# Patient Record
Sex: Male | Born: 1995 | Race: Black or African American | Hispanic: No | Marital: Single | Smoking: Never smoker
Health system: Southern US, Community
[De-identification: ages and names within clinical notes are randomized; demographics above are authoritative.]

## PROBLEM LIST (undated history)

## (undated) ENCOUNTER — Emergency Department (HOSPITAL_COMMUNITY): Admission: EM | Payer: Self-pay | Source: Home / Self Care

## (undated) DIAGNOSIS — J45909 Unspecified asthma, uncomplicated: Secondary | ICD-10-CM

---
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
## 2015-06-08 ENCOUNTER — Emergency Department (HOSPITAL_COMMUNITY): Payer: Medicaid Other

## 2015-06-08 ENCOUNTER — Encounter (HOSPITAL_COMMUNITY): Payer: Self-pay | Admitting: Emergency Medicine

## 2015-06-08 ENCOUNTER — Emergency Department (HOSPITAL_COMMUNITY)
Admission: EM | Admit: 2015-06-08 | Discharge: 2015-06-08 | Disposition: A | Discharge status: Home / Self Care | Payer: Medicaid Other | Attending: Emergency Medicine | Admitting: Emergency Medicine

## 2015-06-08 DIAGNOSIS — Y9389 Activity, other specified: Secondary | ICD-10-CM | POA: Insufficient documentation

## 2015-06-08 DIAGNOSIS — T148XXA Other injury of unspecified body region, initial encounter: Secondary | ICD-10-CM

## 2015-06-08 DIAGNOSIS — T148 Other injury of unspecified body region: Secondary | ICD-10-CM | POA: Diagnosis not present

## 2015-06-08 DIAGNOSIS — H539 Unspecified visual disturbance: Secondary | ICD-10-CM | POA: Diagnosis not present

## 2015-06-08 DIAGNOSIS — S0990XA Unspecified injury of head, initial encounter: Secondary | ICD-10-CM | POA: Diagnosis not present

## 2015-06-08 DIAGNOSIS — Y92410 Unspecified street and highway as the place of occurrence of the external cause: Secondary | ICD-10-CM | POA: Insufficient documentation

## 2015-06-08 DIAGNOSIS — R42 Dizziness and giddiness: Secondary | ICD-10-CM | POA: Diagnosis not present

## 2015-06-08 DIAGNOSIS — S4991XA Unspecified injury of right shoulder and upper arm, initial encounter: Secondary | ICD-10-CM | POA: Insufficient documentation

## 2015-06-08 DIAGNOSIS — Y998 Other external cause status: Secondary | ICD-10-CM | POA: Diagnosis not present

## 2015-06-08 MED ORDER — ACETAMINOPHEN 325 MG PO TABS
650.0000 mg | ORAL_TABLET | Freq: Once | ORAL | Status: AC
  Administered 2015-06-08: 650 mg via ORAL
  Filled 2015-06-08: qty 2

## 2015-06-08 MED ORDER — METAXALONE 800 MG PO TABS: 800.0000 mg | ORAL_TABLET | Freq: Three times a day (TID) | ORAL | Status: AC

## 2015-06-08 MED ORDER — IBUPROFEN 800 MG PO TABS: 800.0000 mg | ORAL_TABLET | Freq: Three times a day (TID) | ORAL | Status: AC
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
## 2015-06-08 NOTE — Discharge Instructions (Signed)
Head Injury °You have received a head injury. It does not appear serious at this time. Headaches and vomiting are common following head injury. It should be easy to awaken from sleeping. Sometimes it is necessary for you to stay in the emergency department for a while for observation. Sometimes admission to the hospital may be needed. After injuries such as yours, most problems occur within the first 24 hours, but side effects may occur up to 7-10 days after the injury. It is important for you to carefully monitor your condition and contact your health care provider or seek immediate medical care if there is a change in your condition. °WHAT ARE THE TYPES OF HEAD INJURIES? °Head injuries can be as minor as a bump. Some head injuries can be more severe. More severe head injuries include: °· A jarring injury to the brain (concussion). °· A bruise of the brain (contusion). This mean there is bleeding in the brain that can cause swelling. °· A cracked skull (skull fracture). °· Bleeding in the brain that collects, clots, and forms a bump (hematoma). °WHAT CAUSES A HEAD INJURY? °A serious head injury is most likely to happen to someone who is in a car wreck and is not wearing a seat belt. Other causes of major head injuries include bicycle or motorcycle accidents, sports injuries, and falls. °HOW ARE HEAD INJURIES DIAGNOSED? °A complete history of the event leading to the injury and your current symptoms will be helpful in diagnosing head injuries. Many times, pictures of the brain, such as CT or MRI are needed to see the extent of the injury. Often, an overnight hospital stay is necessary for observation.  °WHEN SHOULD I SEEK IMMEDIATE MEDICAL CARE?  °You should get help right away if: °· You have confusion or drowsiness. °· You feel sick to your stomach (nauseous) or have continued, forceful vomiting. °· You have dizziness or unsteadiness that is getting worse. °· You have severe, continued headaches not relieved by  medicine. Only take over-the-counter or prescription medicines for pain, fever, or discomfort as directed by your health care provider. °· You do not have normal function of the arms or legs or are unable to walk. °· You notice changes in the black spots in the center of the colored part of your eye (pupil). °· You have a clear or bloody fluid coming from your nose or ears. °· You have a loss of vision. °During the next 24 hours after the injury, you must stay with someone who can watch you for the warning signs. This person should contact local emergency services (911 in the [place] if you have seizures, you become unconscious, or you are unable to wake up. °HOW CAN I PREVENT A HEAD INJURY IN THE FUTURE? °The most important factor for preventing major head injuries is avoiding motor vehicle accidents.  To minimize the potential for damage to your head, it is crucial to wear seat belts while riding in motor vehicles. Wearing helmets while bike riding and playing collision sports (like football) is also helpful. Also, avoiding dangerous activities around the house will further help reduce your risk of head injury.  °WHEN CAN I RETURN TO NORMAL ACTIVITIES AND ATHLETICS? °You should be reevaluated by your health care provider before returning to these activities. If you have any of the following symptoms, you should not return to activities or contact sports until 1 week after the symptoms have stopped: °· Persistent headache. °· Dizziness or vertigo. °· Poor attention and concentration. °· Confusion. °·   Memory problems.  Nausea or vomiting.  Fatigue or tire easily.  Irritability.  Intolerant of bright lights or loud noises.  Anxiety or depression.  Disturbed sleep. MAKE SURE YOU:   Understand these instructions.  Will watch your condition.  Will get help right away if you are not doing well or get worse. Document Released: 12/08/2005 Document Revised: 12/13/2013 Document Reviewed:  08/15/2013 [facility] Patient Information 2015 [place], [place]. This information is not intended to replace advice given to you by your health care provider. Make sure you discuss any questions you have with your health care provider.  Motor Vehicle Collision After a car crash (motor vehicle collision), it is normal to have bruises and sore muscles. The first 24 hours usually feel the worst. After that, you will likely start to feel better each day. HOME CARE  Put ice on the injured area.  Put ice in a plastic bag.  Place a towel between your skin and the bag.  Leave the ice on for 15-20 minutes, 03-04 times a day.  Drink enough fluids to keep your pee (urine) clear or pale yellow.  Do not drink alcohol.  Take a warm shower or bath 1 or 2 times a day. This helps your sore muscles.  Return to activities as told by your doctor. Be careful when lifting. Lifting can make neck or back pain worse.  Only take medicine as told by your doctor. Do not use aspirin. GET HELP RIGHT AWAY IF:   Your arms or legs tingle, feel weak, or lose feeling (numbness).  You have headaches that do not get better with medicine.  You have neck pain, especially in the middle of the back of your neck.  You cannot control when you pee (urinate) or poop (bowel movement).  Pain is getting worse in any part of your body.  You are short of breath, dizzy, or pass out (faint).  You have chest pain.  You feel sick to your stomach (nauseous), throw up (vomit), or sweat.  You have belly (abdominal) pain that gets worse.  There is blood in your pee, poop, or throw up.  You have pain in your shoulder (shoulder strap areas).  Your problems are getting worse. MAKE SURE YOU:   Understand these instructions.  Will watch your condition.  Will get help right away if you are not doing well or get worse. Document Released: 05/26/2008 Document Revised: 03/01/2012 Document Reviewed: 05/07/2011 [facility] Patient  Information 2015 [place], [place]. This information is not intended to replace advice given to you by your health care provider. Make sure you discuss any questions you have with your health care provider.

## 2015-11-29 ENCOUNTER — Emergency Department (HOSPITAL_COMMUNITY)
Admission: EM | Admit: 2015-11-29 | Discharge: 2015-11-29 | Disposition: A | Discharge status: Home / Self Care | Payer: Medicaid Other | Attending: Emergency Medicine | Admitting: Emergency Medicine

## 2015-11-29 ENCOUNTER — Emergency Department (HOSPITAL_COMMUNITY): Payer: Medicaid Other

## 2015-11-29 ENCOUNTER — Encounter (HOSPITAL_COMMUNITY): Payer: Self-pay | Admitting: *Deleted

## 2015-11-29 DIAGNOSIS — R197 Diarrhea, unspecified: Secondary | ICD-10-CM | POA: Insufficient documentation

## 2015-11-29 DIAGNOSIS — R0989 Other specified symptoms and signs involving the circulatory and respiratory systems: Secondary | ICD-10-CM | POA: Insufficient documentation

## 2015-11-29 DIAGNOSIS — R69 Illness, unspecified: Secondary | ICD-10-CM

## 2015-11-29 DIAGNOSIS — J111 Influenza due to unidentified influenza virus with other respiratory manifestations: Secondary | ICD-10-CM | POA: Insufficient documentation

## 2015-11-29 NOTE — ED Notes (Addendum)
Pt states productive cough began Tuesday, dark yellow in color. States he vomited x 2 yesterday and one episode of loose stool this morning. States fever at times. Also states headache.

## 2015-11-29 NOTE — Discharge Instructions (Signed)

## 2015-11-29 NOTE — ED Provider Notes (Signed)
CSN: [id number]     Arrival date & time 11/29/15  1021 History  By signing my name below, I, Lyndel SafeKaitlyn Shelton, attest that this documentation has been prepared under the direction and in the presence of Glynn OctaveStephen Bernardine Langworthy, MD. Electronically Signed: Lyndel SafeKaitlyn Shelton, ED Scribe. 11/29/2015. 11:13 AM.  Chief Complaint  Patient presents with  . Cough  . Emesis   The history is provided by the patient. No language interpreter was used.   HPI Comments: Nathaniel Sanders is a 19 y.o. male, who is a current, daily smoker, presents to the Emergency Department complaining of a constant, moderate cough onset 2 days ago that is productive with dark yellow sputum and induces chest pain that is present only while coughing. He notes an associated headache, chills, rhinorrhea, and 2 episodes of non-post tussive emesis yesterday and 1 episode of diarrhea this morning. Pt has not taken his temperature at home. He has not taken any alleviating medication nor has he tried any alleviating treatments for his symptoms. He did not receive a flu vaccination this year. He reports smoking 2-3 single cigarettes per day. No cardiac or respiratory history. Denies any rashes, generalized myalgias, sick contacts, or recent abroad travel.   History reviewed. No pertinent past medical history. History reviewed. No pertinent past surgical history. No family history on file. Social History  Substance Use Topics  . Smoking status: Never Smoker   . Smokeless tobacco: Never Used  . Alcohol Use: No    Review of Systems  Constitutional: Positive for chills.  HENT: Positive for rhinorrhea.   Respiratory: Positive for cough. Negative for shortness of breath.   Gastrointestinal: Positive for vomiting and diarrhea. Negative for abdominal pain.  Musculoskeletal: Negative for myalgias and arthralgias.  Skin: Negative for rash.  Neurological: Positive for headaches.  A complete 10 system review of systems was obtained and is otherwise  negative except at noted in the HPI and PMH.  Allergies  Sulfa antibiotics  Home Medications   Prior to Admission medications   Medication Sig Start Date End Date Taking? Authorizing Provider  ibuprofen (ADVIL,MOTRIN) 800 MG tablet Take 1 tablet (800 mg total) by mouth 3 (three) times daily. Patient not taking: Reported on 11/29/2015 06/08/15   Burgess AmorJulie Idol, PA-C  metaxalone (SKELAXIN) 800 MG tablet Take 1 tablet (800 mg total) by mouth 3 (three) times daily. Patient not taking: Reported on 11/29/2015 06/08/15   Burgess AmorJulie Idol, PA-C   BP 107/74 mmHg  Pulse 68  Temp(Src) 98.3 F (36.8 C) (Oral)  Resp 18  Ht 5\' 11"  (1.803 m)  Wt 160 lb (72.576 kg)  BMI 22.33 kg/m2  SpO2 99% Physical Exam  Constitutional: He is oriented to person, place, and time. He appears well-developed and well-nourished. No distress.  HENT:  Head: Normocephalic and atraumatic.  Mouth/Throat: Posterior oropharyngeal erythema present. No oropharyngeal exudate.  Mild oropharynx erythema.   Eyes: Conjunctivae and EOM are normal. Pupils are equal, round, and reactive to light.  Neck: Normal range of motion. Neck supple.  No meningismus.  Cardiovascular: Normal rate, regular rhythm, normal heart sounds and intact distal pulses.   No murmur heard. Pulmonary/Chest: Effort normal. No respiratory distress. He has rhonchi. He exhibits no tenderness.  Diffuse, scattered rhonchi.   Abdominal: Soft. There is no tenderness. There is no rebound and no guarding.  Musculoskeletal: Normal range of motion. He exhibits no edema or tenderness.  Neurological: He is alert and oriented to person, place, and time. No cranial nerve deficit. He exhibits normal muscle  tone. Coordination normal.  MAE X4.  Skin: Skin is warm.  Psychiatric: He has a normal mood and affect. His behavior is normal.  Nursing note and vitals reviewed.   ED Course  Procedures  DIAGNOSTIC STUDIES: Oxygen Saturation is 99% on RA, normal by my interpretation.     COORDINATION OF CARE: 11:10 AM Discussed treatment plan which includes to order a chest Xray with pt. Pt acknowledges and agrees to plan.   Imaging Review Dg Chest 2 View  11/29/2015  CLINICAL DATA:  Cough and fever EXAM: CHEST  2 VIEW COMPARISON:  None. FINDINGS: Pulmonary hyperinflation.  Question asthma. Negative for pneumonia. Lungs are clear of infiltrate or effusion. Heart and mediastinum are normal. IMPRESSION: Pulmonary hyperinflation.  Negative for pneumonia Electronically Signed   By: Marlan Palau M.D.   On: 11/29/2015 11:58   I have personally reviewed and evaluated these images as part of my medical decision-making.   MDM   Final diagnoses:  Influenza-like illness   2 days of cough, body aches, chills, nausea, vomiting and diarrhea. No documented fever. No chest pain or shortness of breath.  Nontoxic appearing. No meningismus. Lungs clear. Chest x-ray is negative.  Supportive care for viral syndrome. Possibly influenza. Discussed antipyretics, by mouth hydration, follow up PCP. Return always precautions discussed.  I personally performed the services described in this documentation, which was scribed in my presence. The recorded information has been reviewed and is accurate.   Glynn Octave, MD 11/29/15 [phone]

## 2018-12-27 ENCOUNTER — Emergency Department (HOSPITAL_COMMUNITY)
Admission: EM | Admit: 2018-12-27 | Discharge: 2018-12-27 | Disposition: A | Discharge status: Home / Self Care | Payer: Self-pay | Attending: Emergency Medicine | Admitting: Emergency Medicine

## 2018-12-27 ENCOUNTER — Encounter (HOSPITAL_COMMUNITY): Payer: Self-pay | Admitting: *Deleted

## 2018-12-27 DIAGNOSIS — Z79899 Other long term (current) drug therapy: Secondary | ICD-10-CM | POA: Insufficient documentation

## 2018-12-27 DIAGNOSIS — R197 Diarrhea, unspecified: Secondary | ICD-10-CM | POA: Insufficient documentation

## 2018-12-27 MED ORDER — LOPERAMIDE HCL 2 MG PO CAPS: 4.0000 mg | ORAL_CAPSULE | Freq: Four times a day (QID) | ORAL | 0 refills | Status: AC | PRN

## 2018-12-27 NOTE — ED Provider Notes (Signed)
[facility] EMERGENCY DEPARTMENT Provider Note   CSN: [id number] Arrival date & time: 12/27/18  1413     History   Chief Complaint No chief complaint on file.   HPI Nathaniel Sanders is a 23 y.o. male.  HPI Patient presents to the emergency department with diarrhea that started today.  The patient states he had 7 episodes yesterday and 2 today.  Patient states that nothing seems to make the condition better or worse.  Patient did not take any medications prior to arrival for symptoms.  The patient denies chest pain, shortness of breath, headache,blurred vision, neck pain, fever, cough, weakness, numbness, dizziness, anorexia, edema, abdominal pain, nausea, vomiting, rash, back pain, dysuria, hematemesis, bloody stool, near syncope, or syncope. History reviewed. No pertinent past medical history.  There are no active problems to display for this patient.   History reviewed. No pertinent surgical history.      Home Medications    Prior to Admission medications   Medication Sig Start Date End Date Taking? Authorizing Provider  albuterol (PROVENTIL HFA;VENTOLIN HFA) 108 (90 Base) MCG/ACT inhaler Inhale 2 puffs into the lungs every 6 (six) hours as needed for wheezing or shortness of breath.   Yes [provider]  ibuprofen (ADVIL,MOTRIN) 800 MG tablet Take 1 tablet (800 mg total) by mouth 3 (three) times daily. Patient not taking: Reported on 11/29/2015 06/08/15   Burgess Amor, PA-C  metaxalone (SKELAXIN) 800 MG tablet Take 1 tablet (800 mg total) by mouth 3 (three) times daily. Patient not taking: Reported on 11/29/2015 06/08/15   Burgess Amor, PA-C    Family History History reviewed. No pertinent family history.  Social History Social History   Tobacco Use  . Smoking status: Never Smoker  . Smokeless tobacco: Never Used  Substance Use Topics  . Alcohol use: No  . Drug use: No     Allergies   Sulfa antibiotics   Review of Systems Review of  Systems  All other systems negative except as documented in the HPI. All pertinent positives and negatives as reviewed in the HPI. Physical Exam Updated Vital Signs BP 126/74 (BP Location: Left Arm)   Pulse (!) 107   Temp 98.9 F (37.2 C) (Oral)   Resp 18   SpO2 99%   Physical Exam Vitals signs and nursing note reviewed.  Constitutional:      General: He is not in acute distress.    Appearance: He is well-developed.  HENT:     Head: Normocephalic and atraumatic.  Eyes:     Pupils: Pupils are equal, round, and reactive to light.  Neck:     Musculoskeletal: Normal range of motion and neck supple.  Cardiovascular:     Rate and Rhythm: Normal rate and regular rhythm.     Heart sounds: Normal heart sounds. No murmur. No friction rub. No gallop.   Pulmonary:     Effort: Pulmonary effort is normal. No respiratory distress.     Breath sounds: Normal breath sounds. No wheezing.  Abdominal:     General: Bowel sounds are normal. There is no distension.     Palpations: Abdomen is soft.     Tenderness: There is no abdominal tenderness.  Skin:    General: Skin is warm and dry.     Capillary Refill: Capillary refill takes less than 2 seconds.     Findings: No erythema or rash.  Neurological:     Mental Status: He is alert and oriented to person, place, and time.  Motor: No abnormal muscle tone.     Coordination: Coordination normal.  Psychiatric:        Behavior: Behavior normal.      ED Treatments / Results  Labs (all labs ordered are listed, but only abnormal results are displayed) Labs Reviewed - No data to display  EKG None  Radiology No results found.  Procedures Procedures (including critical care time)  Medications Ordered in ED Medications - No data to display   Initial Impression / Assessment and Plan / ED Course  I have reviewed the triage vital signs and the nursing notes.  Pertinent labs & imaging results that were available during my care of the  patient were reviewed by me and considered in my medical decision making (see chart for details).     Treated for the diarrhea which seems to be improving since he will had 2 episodes today.  Patient is advised to return here as needed.  Told to increase his fluid intake.  Final Clinical Impressions(s) / ED Diagnoses   Final diagnoses:  None    ED Discharge Orders    None       Charlestine Night, PA-C 12/27/18 1756    Sabas Sous, MD 12/27/18 [phone]

## 2018-12-27 NOTE — ED Triage Notes (Signed)
Pt in stating he thinks he has a stomach bug, c/o diarrhea since yesterday, denies n/v, no distress noted

## 2018-12-27 NOTE — ED Notes (Signed)
Patient verbalizes understanding of discharge instructions. Opportunity for questioning and answers were provided. Armband removed by staff, pt discharged from ED.  

## 2018-12-27 NOTE — Discharge Instructions (Addendum)
Return here as needed.  Increase your fluid intake. 

## 2019-04-07 ENCOUNTER — Other Ambulatory Visit: Payer: Self-pay

## 2019-04-09 ENCOUNTER — Encounter (HOSPITAL_COMMUNITY): Payer: Self-pay | Admitting: *Deleted

## 2019-04-09 ENCOUNTER — Other Ambulatory Visit: Payer: Self-pay

## 2019-04-09 ENCOUNTER — Emergency Department (HOSPITAL_COMMUNITY)
Admission: EM | Admit: 2019-04-09 | Discharge: 2019-04-09 | Disposition: A | Discharge status: Home / Self Care | Payer: Self-pay | Attending: Emergency Medicine | Admitting: Emergency Medicine

## 2019-04-09 ENCOUNTER — Emergency Department (HOSPITAL_COMMUNITY): Payer: Self-pay

## 2019-04-09 DIAGNOSIS — Z79899 Other long term (current) drug therapy: Secondary | ICD-10-CM | POA: Insufficient documentation

## 2019-04-09 DIAGNOSIS — R0789 Other chest pain: Secondary | ICD-10-CM | POA: Insufficient documentation

## 2019-04-09 DIAGNOSIS — J45909 Unspecified asthma, uncomplicated: Secondary | ICD-10-CM | POA: Insufficient documentation

## 2019-04-09 DIAGNOSIS — R110 Nausea: Secondary | ICD-10-CM | POA: Insufficient documentation

## 2019-04-09 DIAGNOSIS — R0602 Shortness of breath: Secondary | ICD-10-CM | POA: Insufficient documentation

## 2019-04-09 DIAGNOSIS — F1290 Cannabis use, unspecified, uncomplicated: Secondary | ICD-10-CM | POA: Insufficient documentation

## 2019-04-09 HISTORY — DX: Unspecified asthma, uncomplicated: J45.909

## 2019-04-09 LAB — CBC WITH DIFFERENTIAL/PLATELET
Abs Immature Granulocytes: 0.01 10*3/uL (ref 0.00–0.07)
Basophils Absolute: 0.1 10*3/uL (ref 0.0–0.1)
Basophils Relative: 1 %
Eosinophils Absolute: 0 10*3/uL (ref 0.0–0.5)
Eosinophils Relative: 0 %
HCT: 45.9 % (ref 39.0–52.0)
Hemoglobin: 14.9 g/dL (ref 13.0–17.0)
Immature Granulocytes: 0 %
Lymphocytes Relative: 26 %
Lymphs Abs: 1.7 10*3/uL (ref 0.7–4.0)
MCH: 30.3 pg (ref 26.0–34.0)
MCHC: 32.5 g/dL (ref 30.0–36.0)
MCV: 93.5 fL (ref 80.0–100.0)
Monocytes Absolute: 0.8 10*3/uL (ref 0.1–1.0)
Monocytes Relative: 13 %
Neutro Abs: 3.9 10*3/uL (ref 1.7–7.7)
Neutrophils Relative %: 60 %
Platelets: 305 10*3/uL (ref 150–400)
RBC: 4.91 MIL/uL (ref 4.22–5.81)
RDW: 13.4 % (ref 11.5–15.5)
WBC: 6.5 10*3/uL (ref 4.0–10.5)
nRBC: 0 % (ref 0.0–0.2)

## 2019-04-09 LAB — BASIC METABOLIC PANEL
Anion gap: 10 (ref 5–15)
BUN: 11 mg/dL (ref 6–20)
CO2: 25 mmol/L (ref 22–32)
Calcium: 9.2 mg/dL (ref 8.9–10.3)
Chloride: 104 mmol/L (ref 98–111)
Creatinine, Ser: 1.25 mg/dL — ABNORMAL HIGH (ref 0.61–1.24)
GFR calc Af Amer: >60 mL/min (ref >60)
GFR calc non Af Amer: >60 mL/min (ref >60)
Glucose, Bld: 97 mg/dL (ref 70–99)
Potassium: 3.4 mmol/L — ABNORMAL LOW (ref 3.5–5.1)
Sodium: 139 mmol/L (ref 135–145)

## 2019-04-09 LAB — D-DIMER, QUANTITATIVE: D-Dimer, Quant: <0.27 ug/mL-FEU (ref 0.00–0.50)

## 2019-04-09 LAB — I-STAT TROPONIN, ED: Troponin i, poc: 0.01 ng/mL (ref 0.00–0.08)

## 2019-04-09 LAB — TSH: TSH: 0.576 u[IU]/mL (ref 0.350–4.500)

## 2019-04-09 LAB — TROPONIN I: Troponin I: <0.03 ng/mL (ref <0.03)

## 2019-04-09 MED ORDER — MORPHINE SULFATE (PF) 4 MG/ML IV SOLN
4.0000 mg | Freq: Once | INTRAVENOUS | Status: AC
  Administered 2019-04-09: 17:00:00 4 mg via INTRAVENOUS
  Filled 2019-04-09: qty 1

## 2019-04-09 MED ORDER — ALUM & MAG HYDROXIDE-SIMETH 200-200-20 MG/5ML PO SUSP
30.0000 mL | Freq: Once | ORAL | Status: AC
  Administered 2019-04-09: 17:00:00 30 mL via ORAL
  Filled 2019-04-09: qty 30

## 2019-04-09 MED ORDER — NITROGLYCERIN 0.4 MG SL SUBL: 0.4000 mg | SUBLINGUAL_TABLET | SUBLINGUAL | Status: DC | PRN

## 2019-04-09 MED ORDER — LIDOCAINE VISCOUS HCL 2 % MT SOLN
15.0000 mL | Freq: Once | OROMUCOSAL | Status: AC
  Administered 2019-04-09: 15 mL via ORAL
  Filled 2019-04-09: qty 15

## 2019-04-09 MED ORDER — ASPIRIN 81 MG PO CHEW
324.0000 mg | CHEWABLE_TABLET | Freq: Once | ORAL | Status: AC
  Administered 2019-04-09: 324 mg via ORAL
  Filled 2019-04-09: qty 4

## 2019-04-09 MED ORDER — SODIUM CHLORIDE 0.9 % IV BOLUS
1000.0000 mL | Freq: Once | INTRAVENOUS | Status: AC
  Administered 2019-04-09: 1000 mL via INTRAVENOUS

## 2019-04-09 MED ORDER — ONDANSETRON HCL 4 MG/2ML IJ SOLN
4.0000 mg | Freq: Once | INTRAMUSCULAR | Status: AC
  Administered 2019-04-09: 17:00:00 4 mg via INTRAVENOUS
  Filled 2019-04-09: qty 2

## 2019-04-09 NOTE — ED Notes (Addendum)
Called down to lab to follow up about troponin test.

## 2019-04-09 NOTE — ED Notes (Signed)
Pt reported the center of his chest felt like it was "tingling". RN aware.

## 2019-04-09 NOTE — ED Provider Notes (Signed)
Patient accepted at signout to follow-up on troponin.  Second troponin is normal.  Patient is alert and in no distress.  His vital signs are stable.  We have reviewed return precautions and follow-up plan to get established with a PCP.   Arby Barrette, MD 04/09/19 2140

## 2019-04-09 NOTE — ED Notes (Signed)
Patient returned from xray.

## 2019-04-09 NOTE — ED Provider Notes (Signed)
[facility]-EMERGENCY DEPT Provider Note   CSN: [id number] Arrival date & time: 04/09/19  1532    History   Chief Complaint Chief Complaint  Patient presents with  . Shortness of Breath    HPI    Nathaniel Sanders is a 23 y.o. male with a PMHx of asthma, who presents to the ED with complaints of sudden onset shortness of breath and palpitations that occurred about 1 hour prior to arrival after he smoked marijuana.  Patient states that shortly after smoking marijuana, he suddenly felt short of breath and felt like his heart was racing.  He states that the palpitations felt like a fast heart rate but not irregular.  He states that he checked his heart rate and it was in the 130s.  He reports associated lightheadedness with standing that improves with laying down as well as nausea.  Upon arrival he started experiencing chest pain which he describes as 5/10 constant burning and pressure in the center of his chest that radiates into the left shoulder, with no known aggravating or alleviating factors, no treatments tried prior to arrival.  He also reports having tingling in both hands and a "jittery feeling" throughout his body.  He does not have a PCP at this time.  He denies drinking caffeine or energy drinks.  He is a positive family history of MI in his maternal grandfather as well as his father in his 5530s.  He smokes Black and milds and marijuana but does not smoke cigarettes.  He denies any recent fevers or chills, cough, diaphoresis, leg swelling, recent travel/surgery/immobilization, personal or family history of PE/DVT, abdominal pain, vomiting, diarrhea, constipation, dysuria, hematuria, numbness, focal weakness, claudication, orthopnea, or any other complaints at this time.  The history is provided by the patient and medical records. No language interpreter was used.  Shortness of Breath  Associated symptoms: chest pain   Associated symptoms: no abdominal pain, no cough,  no diaphoresis, no fever and no vomiting     Past Medical History:  Diagnosis Date  . Asthma     There are no active problems to display for this patient.   History reviewed. No pertinent surgical history.      Home Medications    Prior to Admission medications   Medication Sig Start Date End Date Taking? Authorizing Provider  albuterol (PROVENTIL HFA;VENTOLIN HFA) 108 (90 Base) MCG/ACT inhaler Inhale 2 puffs into the lungs every 6 (six) hours as needed for wheezing or shortness of breath.    [provider]  ibuprofen (ADVIL,MOTRIN) 800 MG tablet Take 1 tablet (800 mg total) by mouth 3 (three) times daily. Patient not taking: Reported on 11/29/2015 06/08/15   Burgess AmorIdol, Julie, PA-C  loperamide (IMODIUM) 2 MG capsule Take 2 capsules (4 mg total) by mouth 4 (four) times daily as needed for diarrhea or loose stools. 12/27/18   Lawyer, Cristal Deerhristopher, PA-C  metaxalone (SKELAXIN) 800 MG tablet Take 1 tablet (800 mg total) by mouth 3 (three) times daily. Patient not taking: Reported on 11/29/2015 06/08/15   Burgess AmorIdol, Julie, PA-C    Family History No family history on file.  Social History Social History   Tobacco Use  . Smoking status: Never Smoker  . Smokeless tobacco: Never Used  Substance Use Topics  . Alcohol use: No  . Drug use: Yes    Types: Marijuana     Allergies   Sulfa antibiotics   Review of Systems Review of Systems  Constitutional: Negative for chills, diaphoresis and  fever.  Respiratory: Positive for shortness of breath. Negative for cough.   Cardiovascular: Positive for chest pain and palpitations. Negative for leg swelling.  Gastrointestinal: Positive for nausea. Negative for abdominal pain, constipation, diarrhea and vomiting.  Genitourinary: Negative for dysuria and hematuria.  Musculoskeletal: Negative for arthralgias and myalgias.  Skin: Negative for color change.  Allergic/Immunologic: Negative for immunocompromised state.  Neurological: Positive for  light-headedness. Negative for weakness and numbness.  Psychiatric/Behavioral: Negative for confusion.   All other systems reviewed and are negative for acute change except as noted in the HPI.    Physical Exam Updated Vital Signs BP 138/75 (BP Location: Right Arm)   Pulse 99   Temp 98.7 F (37.1 C) (Oral)   Resp 15   Ht  (1.803 m)   Wt 90.7 kg   SpO2 98%   BMI 27.89 kg/m   Physical Exam Vitals signs and nursing note reviewed.  Constitutional:      General: He is not in acute distress.    Appearance: Normal appearance. He is well-developed. He is not toxic-appearing.     Comments: Afebrile, nontoxic, appears mildly anxious but otherwise in NAD  HENT:     Head: Normocephalic and atraumatic.  Eyes:     General:        Right eye: No discharge.        Left eye: No discharge.     Conjunctiva/sclera: Conjunctivae normal.  Neck:     Musculoskeletal: Normal range of motion and neck supple.  Cardiovascular:     Rate and Rhythm: Regular rhythm. Tachycardia present.     Pulses: Normal pulses.     Heart sounds: Normal heart sounds, S1 normal and S2 normal. No murmur. No friction rub. No gallop.      Comments: Mildly tachycardic in the low 100s during exam, reg rhythm, nl s1/s2, no m/r/g, distal pulses intact, no pedal edema, neg homan's bilaterally Pulmonary:     Effort: Pulmonary effort is normal. No respiratory distress.     Breath sounds: Normal breath sounds. No decreased breath sounds, wheezing, rhonchi or rales.     Comments: CTAB in all lung fields, no w/r/r, no hypoxia or increased WOB, speaking in full sentences, SpO2 98% on RA  Chest:     Chest wall: Tenderness present. No deformity or crepitus.     Comments: Chest wall with mild anterior TTP without crepitus, deformities, or retractions  Abdominal:     General: Bowel sounds are normal. There is no distension.     Palpations: Abdomen is soft. Abdomen is not rigid.     Tenderness: There is no abdominal tenderness.  There is no right CVA tenderness, left CVA tenderness, guarding or rebound. Negative signs include Murphy's sign and McBurney's sign.  Musculoskeletal: Normal range of motion.  Skin:    General: Skin is warm and dry.     Findings: No rash.  Neurological:     Mental Status: He is alert and oriented to person, place, and time.     Sensory: Sensation is intact. No sensory deficit.     Motor: Motor function is intact.  Psychiatric:        Mood and Affect: Affect normal. Mood is anxious.        Behavior: Behavior normal.     Comments: Appears anxious      ED Treatments / Results  Labs (all labs ordered are listed, but only abnormal results are displayed) Labs Reviewed  BASIC METABOLIC PANEL - Abnormal; Notable for  the following components:      Result Value   Potassium 3.4 (*)    Creatinine, Ser 1.25 (*)    All other components within normal limits  CBC WITH DIFFERENTIAL/PLATELET  D-DIMER, QUANTITATIVE (NOT AT [facility])  TSH  I-STAT TROPONIN, ED    EKG EKG Interpretation  Date/Time:  Saturday April 09 2019 15:52:46 EDT Ventricular Rate:  104 PR Interval:    QRS Duration: 87 QT Interval:  308 QTC Calculation: 406 R Axis:   53 Text Interpretation:  Sinus tachycardia RSR' in V1 or V2, right VCD or RVH ST elev, probable normal early repol pattern No old tracing to compare Confirmed by Mancel Bale [phone]) on 04/09/2019 5:08:07 PM   Radiology Dg Chest 2 View  Result Date: 04/09/2019 CLINICAL DATA:  Acute shortness of breath and chest pain. Cough for 1 month. EXAM: CHEST - 2 VIEW COMPARISON:  11/29/2015 chest radiograph FINDINGS: The cardiomediastinal silhouette is unremarkable. There is no evidence of focal airspace disease, pulmonary edema, suspicious pulmonary nodule/mass, pleural effusion, or pneumothorax. No acute bony abnormalities are identified. IMPRESSION: No active cardiopulmonary disease. Electronically Signed   By: Harmon Pier M.D.   On: 04/09/2019 16:47    Procedures  Procedures (including critical care time)  Medications Ordered in ED Medications  aspirin chewable tablet 324 mg (324 mg Oral Given 04/09/19 1659)  morphine 4 MG/ML injection 4 mg (4 mg Intravenous Given 04/09/19 1659)  alum & mag hydroxide-simeth (MAALOX/MYLANTA) 200-200-20 MG/5ML suspension 30 mL (30 mLs Oral Given 04/09/19 1700)    And  lidocaine (XYLOCAINE) 2 % viscous mouth solution 15 mL (15 mLs Oral Given 04/09/19 1700)  ondansetron (ZOFRAN) injection 4 mg (4 mg Intravenous Given 04/09/19 1657)  sodium chloride 0.9 % bolus 1,000 mL (1,000 mLs Intravenous New Bag/Given 04/09/19 1750)     Initial Impression / Assessment and Plan / ED Course  I have reviewed the triage vital signs and the nursing notes.  Pertinent labs & imaging results that were available during my care of the patient were reviewed by me and considered in my medical decision making (see chart for details).        23 y.o. male here with sudden onset shortness of breath, palpitations, lightheadedness, nausea, chest pain, and tingling in both hands that occurred after smoking marijuana.  On exam, appears mildly anxious, mildly tachycardic in the low 100s, clear lung exam, no pedal edema, negative Homans sign, symmetric pulses, mild chest wall tenderness anteriorly.  Patient has a family history of MI in his father in his 21s.  Will get labs, EKG, chest x-ray, give aspirin, nitro, morphine, GI cocktail, Zofran, and reassess shortly.  Discussed case with my attending Dr. Effie Shy who agrees with plan.  6:06 PM CBC w/diff WNL. BMP with mildly elevated Cr 1.25 and mildly low K 3.4, otherwise unremarkable, doubt need for repletion of K but will give fluids. Trop neg. D-dimer neg. TSH WNL. CXR negative. EKG without acute ischemic findings, early repol noted but otherwise fairly unremarkable. Pt feeling better, reports improvement of symptoms, HR now in the 80s. Given family hx and description of symptoms, will get second trop at 3hr  mark. Will reassess after that.   7:43 PM Second trop pending. Patient care to be resumed by Dr. Arby Barrette at shift change sign-out. Patient history has been discussed with provider resuming care. Please see their notes for further documentation of pending results and dispo/care. Pt stable at sign-out and updated on transfer of care.  Final Clinical Impressions(s) / ED Diagnoses   Final diagnoses:  SOB (shortness of breath)  Atypical chest pain  Nausea  Marijuana use    ED Discharge Orders    [address], [place], [place] 04/09/19 1944    Mancel Bale, MD 04/10/19 1026

## 2019-04-09 NOTE — ED Provider Notes (Signed)
  Face-to-face evaluation   History: He presents for evaluation constellation of symptoms after smoking marijuana.  He complains of a tight feeling in his chest.  He has similar problem 2 days ago when he smoked marijuana.  Onset of symptoms today during use of marijuana.  Physical exam: Alert, calm, cooperative.  Heart rate at time of evaluation 103.  Lungs clear anteriorly posteriorly.  No respiratory distress.  No dysarthria, or aphasia.  Medical screening examination/treatment/procedure(s) were conducted as a shared visit with non-physician practitioner(s) and myself.  I personally evaluated the patient during the encounter    Mancel Bale, MD 04/10/19 1026

## 2019-04-09 NOTE — ED Notes (Signed)
Scanner in the room not holding charge, unable to scan medications. Secretary notified

## 2019-04-09 NOTE — ED Notes (Signed)
Bed: WA03 Expected date:  Expected time:  Means of arrival:  Comments: 23 yo SOB, fever

## 2019-04-09 NOTE — ED Notes (Signed)
Patient transported to X-ray 

## 2019-04-09 NOTE — ED Triage Notes (Addendum)
Per EMS pt coming from home with a c/o sudden onset of shortness of breath starting 40 minutes ago, after smoking pot. EMS sts on their arrival pt was tachy at 130, reports hx of anxiety for which he smokes marijuana. Denies sick contact, fever, sore throat. Pt does have hx of asthma. Per EMS lung sounds are clear. No pain.

## 2019-04-11 ENCOUNTER — Emergency Department (HOSPITAL_COMMUNITY)
Admission: EM | Admit: 2019-04-11 | Discharge: 2019-04-11 | Disposition: A | Discharge status: Home / Self Care | Payer: Self-pay | Attending: Emergency Medicine | Admitting: Emergency Medicine

## 2019-04-11 ENCOUNTER — Other Ambulatory Visit: Payer: Self-pay

## 2019-04-11 ENCOUNTER — Encounter (HOSPITAL_COMMUNITY): Payer: Self-pay | Admitting: *Deleted

## 2019-04-11 ENCOUNTER — Emergency Department (HOSPITAL_COMMUNITY): Payer: Self-pay

## 2019-04-11 DIAGNOSIS — R200 Anesthesia of skin: Secondary | ICD-10-CM | POA: Insufficient documentation

## 2019-04-11 DIAGNOSIS — R0789 Other chest pain: Secondary | ICD-10-CM

## 2019-04-11 DIAGNOSIS — R202 Paresthesia of skin: Secondary | ICD-10-CM

## 2019-04-11 DIAGNOSIS — Z79899 Other long term (current) drug therapy: Secondary | ICD-10-CM | POA: Insufficient documentation

## 2019-04-11 DIAGNOSIS — J45909 Unspecified asthma, uncomplicated: Secondary | ICD-10-CM | POA: Insufficient documentation

## 2019-04-11 LAB — BASIC METABOLIC PANEL
Anion gap: 11 (ref 5–15)
BUN: 11 mg/dL (ref 6–20)
CO2: 23 mmol/L (ref 22–32)
Calcium: 9.5 mg/dL (ref 8.9–10.3)
Chloride: 102 mmol/L (ref 98–111)
Creatinine, Ser: 1.28 mg/dL — ABNORMAL HIGH (ref 0.61–1.24)
GFR calc Af Amer: >60 mL/min (ref >60)
GFR calc non Af Amer: >60 mL/min (ref >60)
Glucose, Bld: 91 mg/dL (ref 70–99)
Potassium: 3.7 mmol/L (ref 3.5–5.1)
Sodium: 136 mmol/L (ref 135–145)

## 2019-04-11 LAB — CBC WITH DIFFERENTIAL/PLATELET
Abs Immature Granulocytes: 0 10*3/uL (ref 0.00–0.07)
Basophils Absolute: 0 10*3/uL (ref 0.0–0.1)
Basophils Relative: 1 %
Eosinophils Absolute: 0 10*3/uL (ref 0.0–0.5)
Eosinophils Relative: 0 %
HCT: 46.7 % (ref 39.0–52.0)
Hemoglobin: 14.9 g/dL (ref 13.0–17.0)
Immature Granulocytes: 0 %
Lymphocytes Relative: 30 %
Lymphs Abs: 1.5 10*3/uL (ref 0.7–4.0)
MCH: 29 pg (ref 26.0–34.0)
MCHC: 31.9 g/dL (ref 30.0–36.0)
MCV: 91 fL (ref 80.0–100.0)
Monocytes Absolute: 0.6 10*3/uL (ref 0.1–1.0)
Monocytes Relative: 12 %
Neutro Abs: 2.9 10*3/uL (ref 1.7–7.7)
Neutrophils Relative %: 57 %
Platelets: 324 10*3/uL (ref 150–400)
RBC: 5.13 MIL/uL (ref 4.22–5.81)
RDW: 12.9 % (ref 11.5–15.5)
WBC: 5.1 10*3/uL (ref 4.0–10.5)
nRBC: 0 % (ref 0.0–0.2)

## 2019-04-11 LAB — TROPONIN I: Troponin I: <0.03 ng/mL (ref <0.03)

## 2019-04-11 NOTE — ED Triage Notes (Signed)
Pt from home  And retuns today with on going CP ,tingling of Lt arm and Lt leg. Pt was seen on 04-09-2019 for same problem. Pt reports he last used  Any Marijuana since last visit. Pt denies any N/V/D. No fevers . Pt reports he is eating normally.

## 2019-04-11 NOTE — ED Provider Notes (Signed)
[facility] EMERGENCY DEPARTMENT Provider Note   CSN: [id number] Arrival date & time: 04/11/19  1115    History   Chief Complaint Chief Complaint  Patient presents with  . Chest Pain    HPI Nathaniel Sanders is a 23 y.o. male.     Patient is a 23 year old male with past medical history of asthma.  He presents today with complaints of chest discomfort.  This is been ongoing for the past 3 days.  It started shortly after smoking marijuana he felt may have been laced with something.  He was seen here 2 days ago with similar complaints.  He had cardiac work-up performed which was unremarkable and he was discharged to home.  He returns today with ongoing discomfort in his chest that he describes as a pressure.  He states that this radiates to his left arm and that his left arm and left leg feel numb.  He is also complaining of mild headache.  He denies any visual disturbances.  He denies any nausea, or diaphoresis.  The history is provided by the patient.  Chest Pain  Pain location:  Substernal area Pain quality: pressure   Pain radiates to:  L shoulder and L arm Pain severity:  Moderate Onset quality:  Sudden Duration:  3 days Timing:  Constant Progression:  Worsening Chronicity:  New Context: drug use   Relieved by:  Nothing Worsened by:  Nothing Ineffective treatments:  None tried   Past Medical History:  Diagnosis Date  . Asthma     There are no active problems to display for this patient.   History reviewed. No pertinent surgical history.      Home Medications    Prior to Admission medications   Medication Sig Start Date End Date Taking? Authorizing Provider  albuterol (PROVENTIL HFA;VENTOLIN HFA) 108 (90 Base) MCG/ACT inhaler Inhale 2 puffs into the lungs every 6 (six) hours as needed for wheezing or shortness of breath.    [provider]  ibuprofen (ADVIL,MOTRIN) 800 MG tablet Take 1 tablet (800 mg total) by mouth 3 (three) times  daily. Patient not taking: Reported on 11/29/2015 06/08/15   Burgess Amor, PA-C  loperamide (IMODIUM) 2 MG capsule Take 2 capsules (4 mg total) by mouth 4 (four) times daily as needed for diarrhea or loose stools. Patient not taking: Reported on 04/09/2019 12/27/18   Charlestine Night, PA-C  metaxalone (SKELAXIN) 800 MG tablet Take 1 tablet (800 mg total) by mouth 3 (three) times daily. Patient not taking: Reported on 11/29/2015 06/08/15   Burgess Amor, PA-C    Family History History reviewed. No pertinent family history.  Social History Social History   Tobacco Use  . Smoking status: Never Smoker  . Smokeless tobacco: Never Used  Substance Use Topics  . Alcohol use: No  . Drug use: Yes    Types: Marijuana     Allergies   Sulfa antibiotics   Review of Systems Review of Systems  Cardiovascular: Positive for chest pain.  All other systems reviewed and are negative.    Physical Exam Updated Vital Signs BP 132/75 (BP Location: Right Arm)   Pulse 78   Temp 98.6 F (37 C) (Oral)   Resp 20   Ht  (1.803 m)   Wt 90.7 kg   SpO2 100%   BMI 27.89 kg/m   Physical Exam Vitals signs and nursing note reviewed.  Constitutional:      General: He is not in acute distress.  Appearance: He is well-developed. He is not diaphoretic.  HENT:     Head: Normocephalic and atraumatic.  Neck:     Musculoskeletal: Normal range of motion and neck supple.  Cardiovascular:     Rate and Rhythm: Normal rate and regular rhythm.     Heart sounds: No murmur. No friction rub.  Pulmonary:     Effort: Pulmonary effort is normal. No respiratory distress.     Breath sounds: Normal breath sounds. No wheezing or rales.  Abdominal:     General: Bowel sounds are normal. There is no distension.     Palpations: Abdomen is soft.     Tenderness: There is no abdominal tenderness.  Musculoskeletal: Normal range of motion.  Skin:    General: Skin is warm and dry.  Neurological:     Mental Status: He  is alert and oriented to person, place, and time.     Cranial Nerves: No cranial nerve deficit.     Coordination: Coordination normal.     Comments: Strength is 5 out of 5 in the right upper and right lower extremity.  Strength is 4+ out of 5 in the left upper and left lower extremity.      ED Treatments / Results  Labs (all labs ordered are listed, but only abnormal results are displayed) Labs Reviewed  BASIC METABOLIC PANEL  CBC WITH DIFFERENTIAL/PLATELET  TROPONIN I    EKG EKG Interpretation  Date/Time:  Monday April 11 2019 11:28:20 EDT Ventricular Rate:  74 PR Interval:    QRS Duration: 113 QT Interval:  340 QTC Calculation: 378 R Axis:   71 Text Interpretation:  Sinus arrhythmia Borderline intraventricular conduction delay RSR' in V1 or V2, probably normal variant ST elevation, likely early repolarization Confirmed by Geoffery Lyons ([id number]) on 04/11/2019 11:31:06 AM   Radiology Dg Chest 2 View  Result Date: 04/09/2019 CLINICAL DATA:  Acute shortness of breath and chest pain. Cough for 1 month. EXAM: CHEST - 2 VIEW COMPARISON:  11/29/2015 chest radiograph FINDINGS: The cardiomediastinal silhouette is unremarkable. There is no evidence of focal airspace disease, pulmonary edema, suspicious pulmonary nodule/mass, pleural effusion, or pneumothorax. No acute bony abnormalities are identified. IMPRESSION: No active cardiopulmonary disease. Electronically Signed   By: Harmon Pier M.D.   On: 04/09/2019 16:47    Procedures Procedures (including critical care time)  Medications Ordered in ED Medications - No data to display   Initial Impression / Assessment and Plan / ED Course  I have reviewed the triage vital signs and the nursing notes.  Pertinent labs & imaging results that were available during my care of the patient were reviewed by me and considered in my medical decision making (see chart for details).  Patient is a 23 year old male presenting with complaints of  chest discomfort and left arm and leg tingling.  This is been ongoing for the past 3 days.  He was seen 2 days ago and had negative cardiac work-up.  His EKG today is unchanged and troponin is again negative.  I highly doubt a cardiac etiology.  His neurologic exam is somewhat inconsistent, but he does seem to be slightly weaker on the left.  A head CT was obtained which was also unremarkable.  I highly doubt an acute neurologic event.  Patient does admit to smoking what he thought was marijuana prior to the onset of symptoms.  It is possible that this is the cause of his symptoms.  I have advised the patient to increase his fluid intake  for the next several days and return as needed.  Final Clinical Impressions(s) / ED Diagnoses   Final diagnoses:  None    ED Discharge Orders    None       Geoffery Lyonselo, Craig Ionescu, MD 04/11/19 1357

## 2019-04-11 NOTE — ED Notes (Signed)
Got patient on the monitor did ekg shown to Dr Delo patient is resting with call bell in reach 

## 2019-04-11 NOTE — Discharge Instructions (Addendum)
Drink plenty of fluids and get plenty of rest.  Refrain from further marijuana and other substance abuse.  Follow-up with primary doctor if symptoms or not improving in the next 3 to 4 days.

## 2020-11-02 IMAGING — CT CT HEAD WITHOUT CONTRAST
4 series · 16 of 47 positions shown, 18 images · non-contrast
Comparison: Prior CT scan of the head 04/11/2019

CLINICAL DATA: 23-year-old male with mouth, left arm and left leg
numbness

EXAM:
CT HEAD WITHOUT CONTRAST
TECHNIQUE: Contiguous axial images were obtained from the base of the skull
through the vertex without intravenous contrast.

[Series 3: head wo · axial · 0.46mm/px · z∈[-136,-16]mm · 7 of 33 slices shown, 9 images]
[im 5/33  brain]
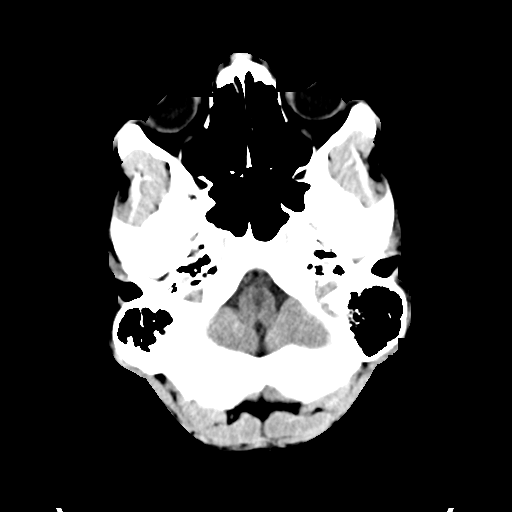
[im 5/33  bone]
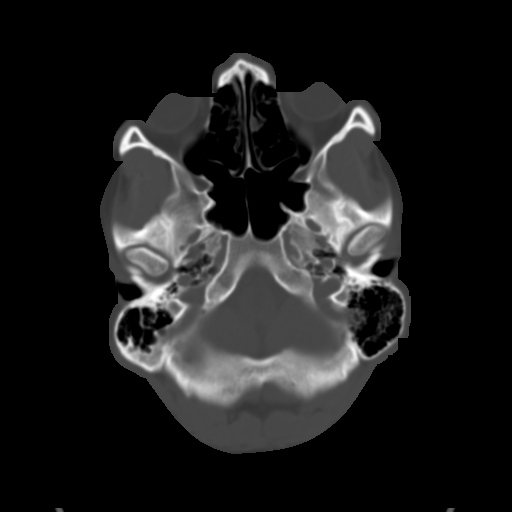
[im 9/33  brain]
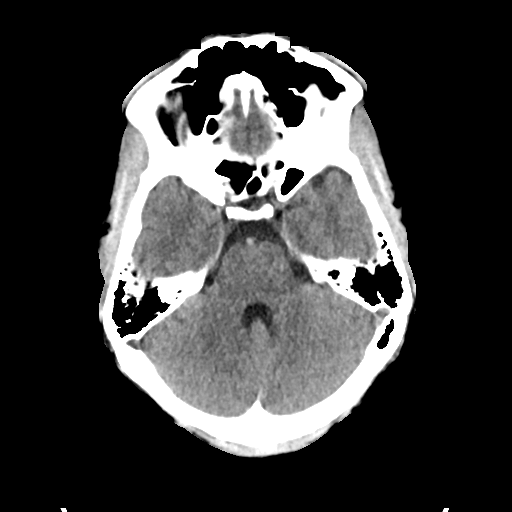
[im 13/33  brain]
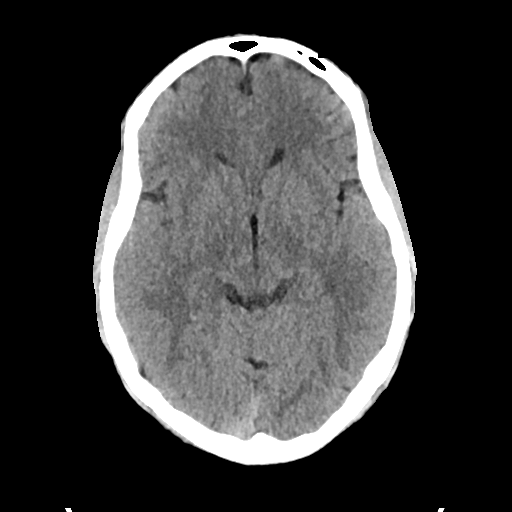
[im 17/33  brain]
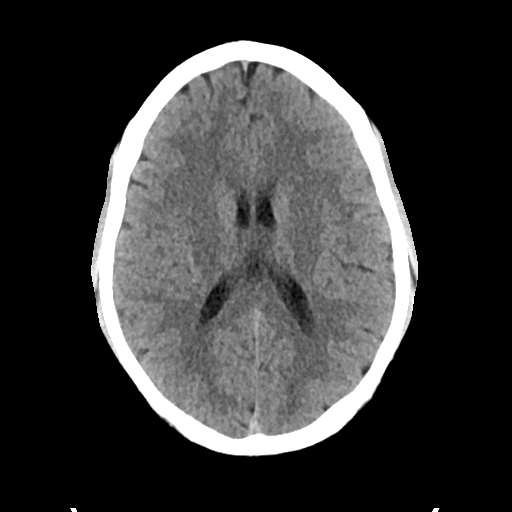
[im 21/33  brain]
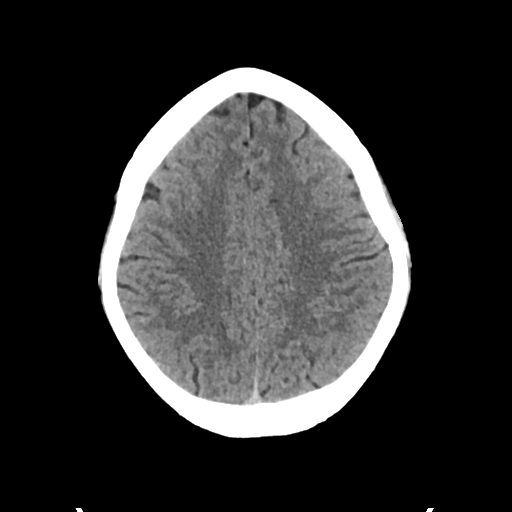
[im 21/33  bone]
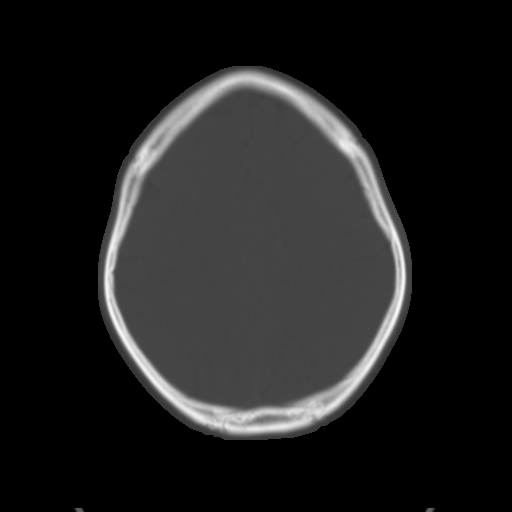
[im 25/33  brain]
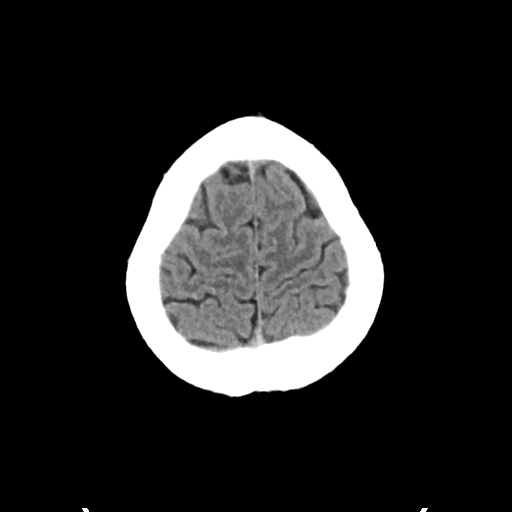
[im 29/33  brain]
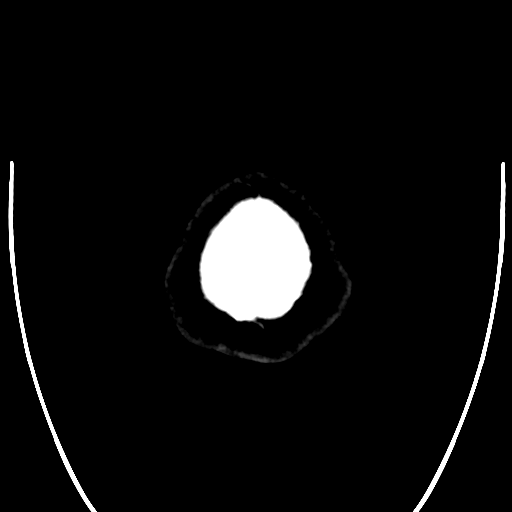

[Series 4: head bone · axial · 0.46mm/px · z∈[-140,-108]mm · 3 of 81 slices shown]
[im 9/81  bone]
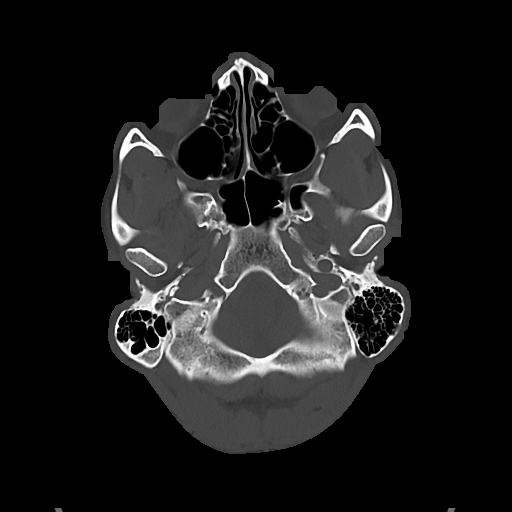
[im 17/81  bone]
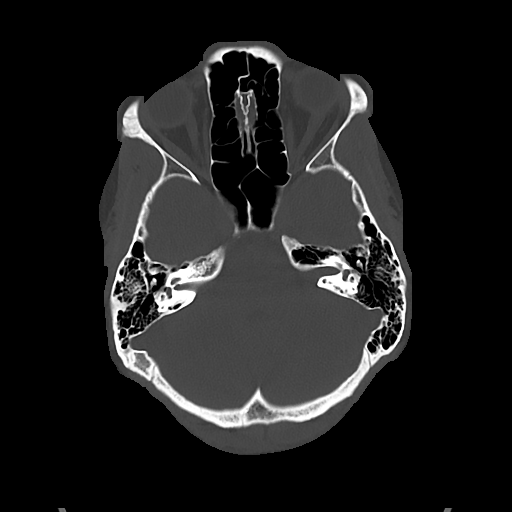
[im 25/81  bone]
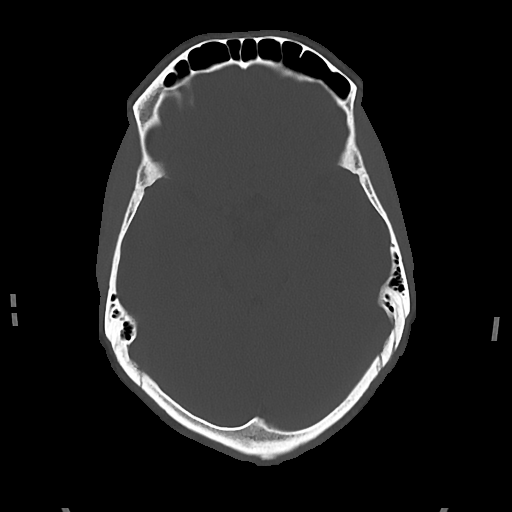

[Series 5: cor soft · coronal · 0.31mm/px · 3 of 70 slices shown]
[im 24/70  brain]
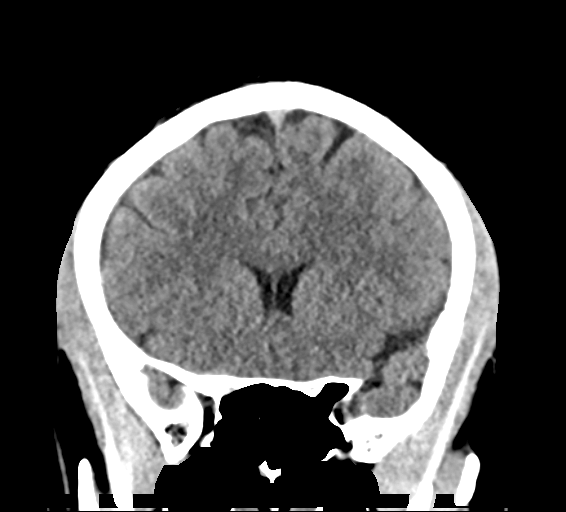
[im 31/70  brain]
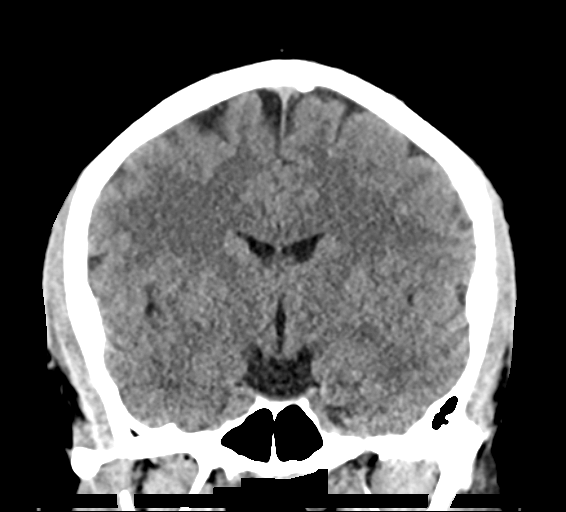
[im 39/70  brain]
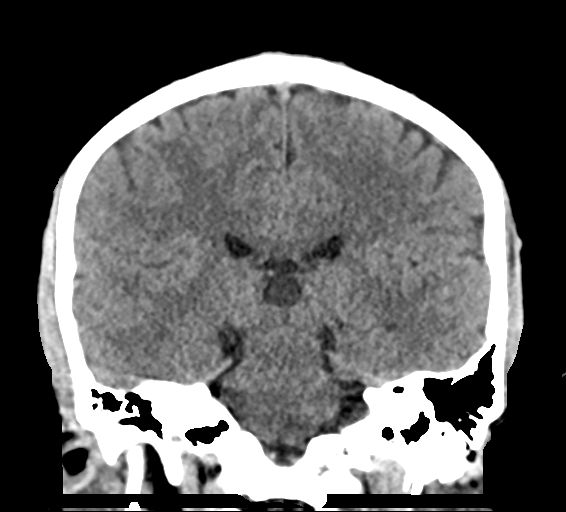

[Series 6: sag soft · sagittal · 0.31mm/px · 3 of 67 slices shown]
[im 23/67  brain]
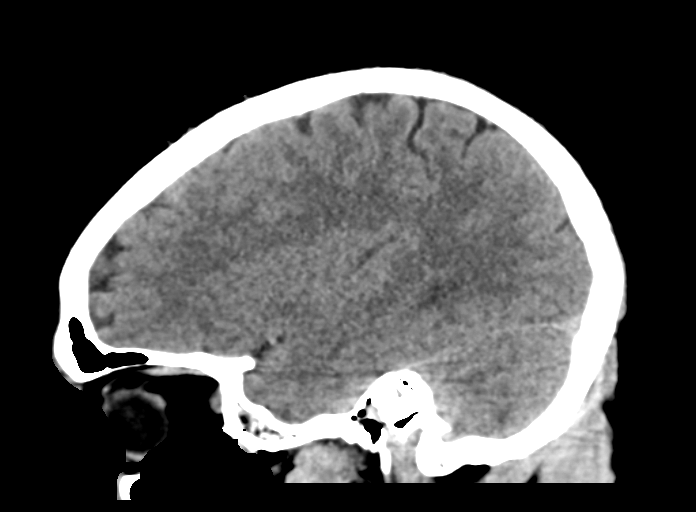
[im 34/67  brain]
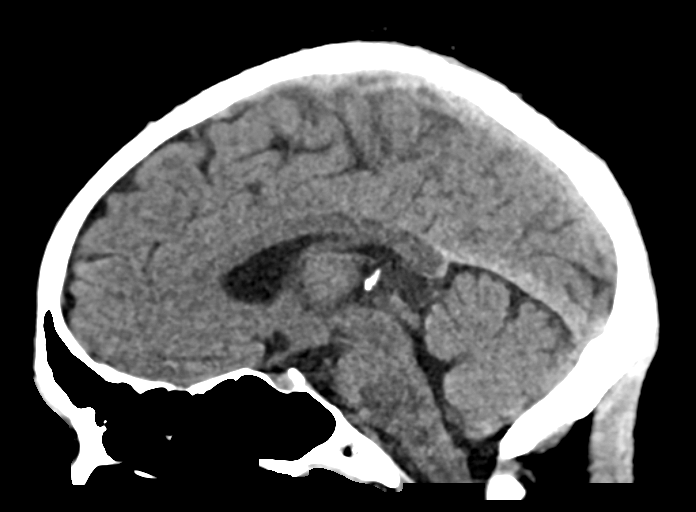
[im 45/67  brain]
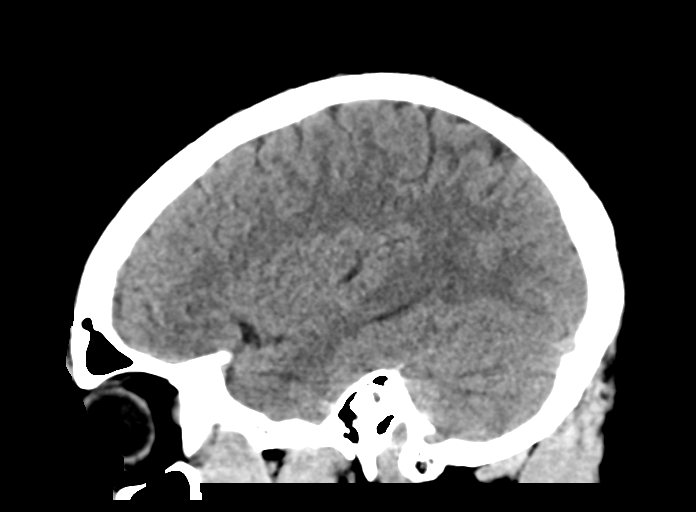

[16 of 47 positions shown; findings below may reference images not displayed]

FINDINGS: Brain: No evidence of acute infarction, hemorrhage, hydrocephalus,
extra-axial collection or mass lesion/mass effect.

Vascular: No hyperdense vessel or unexpected calcification.

Skull: Normal. Negative for fracture or focal lesion.

Sinuses/Orbits: No acute finding.

Other: None.
IMPRESSION: Negative head CT.
